# Patient Record
Sex: Male | Born: 1955
Health system: Southern US, Community
[De-identification: ages and names within clinical notes are randomized; demographics above are authoritative.]

## PROBLEM LIST (undated history)

## (undated) DIAGNOSIS — I10 Essential (primary) hypertension: Secondary | ICD-10-CM

## (undated) HISTORY — PX: ABDOMINAL SURGERY: SHX537

---

## 2015-03-09 ENCOUNTER — Other Ambulatory Visit (HOSPITAL_COMMUNITY): Payer: Self-pay | Admitting: Family Medicine

## 2015-03-09 DIAGNOSIS — Z841 Family history of disorders of kidney and ureter: Secondary | ICD-10-CM

## 2015-03-23 ENCOUNTER — Emergency Department (HOSPITAL_COMMUNITY)
Admission: EM | Admit: 2015-03-23 | Discharge: 2015-03-23 | Disposition: A | Discharge status: Home / Self Care | Payer: Self-pay | Attending: Emergency Medicine | Admitting: Emergency Medicine

## 2015-03-23 ENCOUNTER — Encounter (HOSPITAL_COMMUNITY): Payer: Self-pay | Admitting: Neurology

## 2015-03-23 DIAGNOSIS — I10 Essential (primary) hypertension: Secondary | ICD-10-CM

## 2015-03-23 DIAGNOSIS — Z87891 Personal history of nicotine dependence: Secondary | ICD-10-CM | POA: Insufficient documentation

## 2015-03-23 HISTORY — DX: Essential (primary) hypertension: I10

## 2015-03-23 LAB — I-STAT CHEM 8, ED
BUN: 23 mg/dL — ABNORMAL HIGH (ref 6–20)
Calcium, Ion: 1.19 mmol/L (ref 1.12–1.23)
Chloride: 103 mmol/L (ref 101–111)
Creatinine, Ser: 1.4 mg/dL — ABNORMAL HIGH (ref 0.61–1.24)
Glucose, Bld: 106 mg/dL — ABNORMAL HIGH (ref 65–99)
HCT: 46 % (ref 39.0–52.0)
Hemoglobin: 15.6 g/dL (ref 13.0–17.0)
Potassium: 4 mmol/L (ref 3.5–5.1)
Sodium: 141 mmol/L (ref 135–145)
TCO2: 27 mmol/L (ref 0–100)

## 2015-03-23 MED ORDER — BENAZEPRIL HCL 10 MG PO TABS: 10.0000 mg | ORAL_TABLET | Freq: Every day | ORAL | Status: AC

## 2015-03-23 MED ORDER — AMLODIPINE BESYLATE 5 MG PO TABS: 10.0000 mg | ORAL_TABLET | Freq: Every day | ORAL | Status: AC

## 2015-03-23 NOTE — Discharge Instructions (Signed)
Return here as needed. Follow up for a recheck with your doctor. INcrease your fluid intake

## 2015-03-23 NOTE — ED Notes (Signed)
Pt here after going to a doctor for a disability claim and found to have high BP. 178/122, 179/130, 183/131. Is taking lisinopril and followed by the [place]. Is here with h/a. Today BP 167/135. Denies cp or sob. Is a x 4.

## 2015-03-25 ENCOUNTER — Ambulatory Visit (HOSPITAL_COMMUNITY)
Admission: RE | Admit: 2015-03-25 | Discharge: 2015-03-25 | Disposition: A | Discharge status: Home / Self Care | Payer: No Typology Code available for payment source | Source: Ambulatory Visit | Attending: Family Medicine | Admitting: Family Medicine

## 2015-03-25 DIAGNOSIS — Z841 Family history of disorders of kidney and ureter: Secondary | ICD-10-CM

## 2015-03-26 NOTE — ED Provider Notes (Signed)
CSN: [id number]     Arrival date & time 03/23/15  1119 History   First MD Initiated Contact with Patient 03/23/15 1218     Chief Complaint  Patient presents with  . Hypertension     (Consider location/radiation/quality/duration/timing/severity/associated sxs/prior Treatment) HPI Patient presents to the emergency department with high blood pressure and that has been a chronic problem for the patient.  He is actually off of to his blood pressure medications.  He states that he has been seen at the [facility] for this and they wanted to see how his blood pressure would respond only one medication.  Patient was getting a disability claim, when they noted his blood pressure was high and sent him to the emergency department.  The patient denies chest pain, short of breath, nausea, vomiting, weakness, dizziness, headache, blurred vision, back pain, neck pain, fever, cough, dysuria, incontinence, bloody stool, hematemesis, abdominal pain, decreased urinary output, edema, near syncope or syncope.  The patient states that he has had some mild headache at times, but does not currently have a headache.  Nothing seems make the condition better or worse Past Medical History  Diagnosis Date  . Hypertension    No past surgical history on file. No family history on file. Social History  Substance Use Topics  . Smoking status: Former Games developer  . Smokeless tobacco: None  . Alcohol Use: Yes    Review of Systems  All other systems negative except as documented in the HPI. All pertinent positives and negatives as reviewed in the HPI.  Allergies  Review of patient's allergies indicates no known allergies.  Home Medications   Prior to Admission medications   Medication Sig Start Date End Date Taking? Authorizing Provider  amLODipine (NORVASC) 5 MG tablet Take 2 tablets (10 mg total) by mouth daily. 03/23/15   Manav Pierotti, PA-C  benazepril (LOTENSIN) 10 MG tablet Take 1 tablet (10 mg total) by mouth  daily. 03/23/15   Shenekia Riess, PA-C   BP 179/125 mmHg  Pulse 66  Temp(Src) 98.2 F (36.8 C) (Oral)  Resp 16  SpO2 100% Physical Exam  Constitutional: He is oriented to person, place, and time. He appears well-developed and well-nourished. No distress.  HENT:  Head: Normocephalic and atraumatic.  Mouth/Throat: Oropharynx is clear and moist.  Eyes: Pupils are equal, round, and reactive to light.  Neck: Normal range of motion. Neck supple.  Cardiovascular: Normal rate, regular rhythm and normal heart sounds.  Exam reveals no gallop and no friction rub.   No murmur heard. Pulmonary/Chest: Effort normal and breath sounds normal. No respiratory distress. He has no wheezes.  Abdominal: Soft. Bowel sounds are normal. He exhibits no distension. There is no tenderness.  Neurological: He is alert and oriented to person, place, and time. He exhibits normal muscle tone. Coordination normal.  Skin: Skin is warm and dry. No rash noted. No erythema.  Psychiatric: He has a normal mood and affect. His behavior is normal.  Nursing note and vitals reviewed.   ED Course  Procedures (including critical care time) Labs Review Labs Reviewed  I-STAT CHEM 8, ED - Abnormal; Notable for the following:    BUN 23 (*)    Creatinine, Ser 1.40 (*)    Glucose, Bld 106 (*)    All other components within normal limits    Imaging Review No results found. I have personally reviewed and evaluated these images and lab results as part of my medical decision-making.   EKG Interpretation   Date/Time:  Tuesday March 23 2015 11:36:08 EST Ventricular Rate:  76 PR Interval:  164 QRS Duration: 82 QT Interval:  370 QTC Calculation: 416 R Axis:   57 Text Interpretation:  Normal sinus rhythm T wave abnormality, consider  inferior ischemia Abnormal ECG ED PHYSICIAN INTERPRETATION AVAILABLE IN  [facility] Confirmed by TEST, Record ([id number]) on 03/24/2015 8:01:09 AM      Patient is advised follow-up  with his primary care doctor.  He does not have any signs end organ damage as result of his blood pressure.  Patient was started back on the 2 medications that were stopped.  Patient's best return here as needed.  Told to monitor his blood pressure   Charlestine Night, PA-C 03/27/15 0000  Blane Ohara, MD 03/28/15 [phone]

## 2016-08-07 ENCOUNTER — Emergency Department (HOSPITAL_COMMUNITY): Payer: Non-veteran care

## 2016-08-07 ENCOUNTER — Encounter (HOSPITAL_COMMUNITY): Payer: Self-pay | Admitting: Emergency Medicine

## 2016-08-07 ENCOUNTER — Emergency Department (HOSPITAL_COMMUNITY)
Admission: EM | Admit: 2016-08-07 | Discharge: 2016-08-07 | Disposition: A | Discharge status: Home / Self Care | Payer: Non-veteran care | Attending: Emergency Medicine | Admitting: Emergency Medicine

## 2016-08-07 DIAGNOSIS — K2970 Gastritis, unspecified, without bleeding: Secondary | ICD-10-CM | POA: Diagnosis not present

## 2016-08-07 DIAGNOSIS — R809 Proteinuria, unspecified: Secondary | ICD-10-CM | POA: Diagnosis not present

## 2016-08-07 DIAGNOSIS — I10 Essential (primary) hypertension: Secondary | ICD-10-CM | POA: Insufficient documentation

## 2016-08-07 DIAGNOSIS — R112 Nausea with vomiting, unspecified: Secondary | ICD-10-CM

## 2016-08-07 DIAGNOSIS — Z87891 Personal history of nicotine dependence: Secondary | ICD-10-CM | POA: Diagnosis not present

## 2016-08-07 DIAGNOSIS — R634 Abnormal weight loss: Secondary | ICD-10-CM | POA: Diagnosis not present

## 2016-08-07 DIAGNOSIS — N2889 Other specified disorders of kidney and ureter: Secondary | ICD-10-CM | POA: Diagnosis not present

## 2016-08-07 DIAGNOSIS — R1013 Epigastric pain: Secondary | ICD-10-CM | POA: Diagnosis not present

## 2016-08-07 DIAGNOSIS — Z79899 Other long term (current) drug therapy: Secondary | ICD-10-CM | POA: Insufficient documentation

## 2016-08-07 DIAGNOSIS — R935 Abnormal findings on diagnostic imaging of other abdominal regions, including retroperitoneum: Secondary | ICD-10-CM

## 2016-08-07 DIAGNOSIS — N289 Disorder of kidney and ureter, unspecified: Secondary | ICD-10-CM

## 2016-08-07 LAB — COMPREHENSIVE METABOLIC PANEL
ALK PHOS: 62 U/L (ref 38–126)
ALT: 12 U/L — ABNORMAL LOW (ref 17–63)
ANION GAP: 12 (ref 5–15)
AST: 19 U/L (ref 15–41)
Albumin: 4.1 g/dL (ref 3.5–5.0)
BILIRUBIN TOTAL: 0.9 mg/dL (ref 0.3–1.2)
BUN: 17 mg/dL (ref 6–20)
CALCIUM: 9.5 mg/dL (ref 8.9–10.3)
CO2: 25 mmol/L (ref 22–32)
Chloride: 98 mmol/L — ABNORMAL LOW (ref 101–111)
Creatinine, Ser: 1.39 mg/dL — ABNORMAL HIGH (ref 0.61–1.24)
GFR calc Af Amer: >60 mL/min (ref >60)
GFR calc non Af Amer: 54 mL/min — ABNORMAL LOW (ref >60)
GLUCOSE: 118 mg/dL — AB (ref 65–99)
POTASSIUM: 3.7 mmol/L (ref 3.5–5.1)
Sodium: 135 mmol/L (ref 135–145)
TOTAL PROTEIN: 7.2 g/dL (ref 6.5–8.1)

## 2016-08-07 LAB — CBC
HEMATOCRIT: 44 % (ref 39.0–52.0)
HEMOGLOBIN: 14.6 g/dL (ref 13.0–17.0)
MCH: 28.5 pg (ref 26.0–34.0)
MCHC: 33.2 g/dL (ref 30.0–36.0)
MCV: 85.8 fL (ref 78.0–100.0)
Platelets: 281 10*3/uL (ref 150–400)
RBC: 5.13 MIL/uL (ref 4.22–5.81)
RDW: 13.6 % (ref 11.5–15.5)
WBC: 8.1 10*3/uL (ref 4.0–10.5)

## 2016-08-07 LAB — URINALYSIS, ROUTINE W REFLEX MICROSCOPIC
BACTERIA UA: NONE SEEN
BILIRUBIN URINE: NEGATIVE
GLUCOSE, UA: NEGATIVE mg/dL
HGB URINE DIPSTICK: NEGATIVE
Ketones, ur: 5 mg/dL — AB
LEUKOCYTES UA: NEGATIVE
NITRITE: NEGATIVE
Protein, ur: 30 mg/dL — AB
SPECIFIC GRAVITY, URINE: 1.021 (ref 1.005–1.030)
Squamous Epithelial / LPF: NONE SEEN
pH: 5 (ref 5.0–8.0)

## 2016-08-07 LAB — LIPASE, BLOOD: Lipase: 29 U/L (ref 11–51)

## 2016-08-07 MED ORDER — HYDROCODONE-ACETAMINOPHEN 5-325 MG PO TABS: 1.0000 | ORAL_TABLET | Freq: Four times a day (QID) | ORAL | 0 refills | Status: DC | PRN

## 2016-08-07 MED ORDER — FAMOTIDINE IN NACL 20-0.9 MG/50ML-% IV SOLN
20.0000 mg | Freq: Once | INTRAVENOUS | Status: AC
  Administered 2016-08-07: 20 mg via INTRAVENOUS
  Filled 2016-08-07: qty 50

## 2016-08-07 MED ORDER — ONDANSETRON 4 MG PO TBDP
ORAL_TABLET | ORAL | Status: AC
  Filled 2016-08-07: qty 1

## 2016-08-07 MED ORDER — ONDANSETRON 4 MG PO TBDP
4.0000 mg | ORAL_TABLET | Freq: Once | ORAL | Status: AC | PRN
  Administered 2016-08-07: 4 mg via ORAL

## 2016-08-07 MED ORDER — GI COCKTAIL ~~LOC~~
30.0000 mL | Freq: Once | ORAL | Status: AC
  Administered 2016-08-07: 30 mL via ORAL
  Filled 2016-08-07: qty 30

## 2016-08-07 MED ORDER — IOPAMIDOL (ISOVUE-300) INJECTION 61%
INTRAVENOUS | Status: AC
  Administered 2016-08-07: 100 mL
  Filled 2016-08-07: qty 100

## 2016-08-07 MED ORDER — LISINOPRIL 10 MG PO TABS
5.0000 mg | ORAL_TABLET | Freq: Once | ORAL | Status: AC
  Administered 2016-08-07: 5 mg via ORAL
  Filled 2016-08-07: qty 1

## 2016-08-07 MED ORDER — ONDANSETRON 4 MG PO TBDP: 4.0000 mg | ORAL_TABLET | Freq: Three times a day (TID) | ORAL | 0 refills | Status: AC | PRN

## 2016-08-07 MED ORDER — RANITIDINE HCL 150 MG PO TABS: 150.0000 mg | ORAL_TABLET | Freq: Two times a day (BID) | ORAL | 0 refills | Status: AC

## 2016-08-07 MED ORDER — SODIUM CHLORIDE 0.9 % IV BOLUS (SEPSIS)
1000.0000 mL | Freq: Once | INTRAVENOUS | Status: AC
  Administered 2016-08-07: 1000 mL via INTRAVENOUS

## 2016-08-07 NOTE — ED Notes (Signed)
Pt's wife stated, he has not had his medication due to eating. Given crackers and ginger ale to take medication

## 2016-08-07 NOTE — Discharge Instructions (Signed)
The CT scan of your belly showed that you have some lesions on both kidneys, which is concerning for possible cancer. It's very important that you speak with your regular doctor as soon as possible, and tell them that you need to have a follow up evaluation with an oncologist as soon as possible, perhaps while you're at the [facility] for your appointment next week. You will need further outpatient work up for this CT finding.   You may want to get the images of the CT from today's visit; you can come back during normal business hours to the medical records department in order to get a CD with copies of the images from today's CT.  Your symptoms could also be contributed by gastritis or an ulcer. You will need to take zantac as directed, and avoid spicy/fatty/acidic foods, avoid soda/coffee/tea/alcohol. Avoid laying down flat within 30 minutes of eating. Avoid NSAIDs like ibuprofen/aleve/motrin/etc on an empty stomach. May consider using over the counter tums/maalox as needed for additional relief. Use zofran as directed as needed for nausea. Stay very well hydrated with plenty of water. Use norco as directed as needed for pain but don't drive or operate machinery while taking this medication. This medication may cause constipation, increase the fiber and water in your diet, and if you develop constipation from the medication, consider taking miralax or over the counter stool softener to help with this issue. Follow up with your regular doctor in 1 week for recheck of symptoms and ongoing management of your abnormal CT findings, in addition to oncology as previously stated above. Return to the ER for changes or worsening symptoms.

## 2016-08-07 NOTE — ED Triage Notes (Signed)
Pt reports abd pain, RUQ, x 1 week, followed by n/v/d for 6 days.  Pt reports subjective chills, denies any other family members with same s/s.  Pt reports hx abd surgery in RUQ for tumor removal.

## 2016-08-07 NOTE — ED Notes (Signed)
Patient transported to CT 

## 2016-08-07 NOTE — ED Provider Notes (Signed)
[facility]-EMERGENCY DEPT Provider Note   CSN: [id number] Arrival date & time: 08/07/16  1006     History   Chief Complaint Chief Complaint  Patient presents with  . Emesis  . Abdominal Pain  . Diarrhea    HPI Eric Mckee is a 61 y.o. male with a PMHx of HTN and RUQ surgery in 2012 at the [place] for "tumor removal" although he's not sure where the tumor was (?gallbladder, but not sure), who presents to the ED with complaints of epigastric abdominal pain that began one week ago with associated nausea, vomiting, constipation, and chills. He describes his pain is 7/10 intermittent sharp nonradiating epigastric abdominal pain that worsens when he vomits and has been unrelieved with BC powders, Tylenol, and fluid intake. He states that he has not been able to keep anything down over the last week, including his blood pressure medications. He hasn't taken any BP meds today. He usually is on lisinopril 5 mg daily. He states that he has had too numerous to count episodes of nonbloody nonbilious emesis. He also mentions that he hasn't had a BM in 1 week but he attributes that to the fact that he has not been able to keep anything in his stomach this week. Additionally reports a 23 pound weight loss since March and he wonders whether his prior tumor surgery could be contributing to his pain and symptoms today. He can't recall where the tumor was that was removed, he thinks it could be his gallbladder however he's not sure; doesn't know if he still has his gallbladder. He has an appt with his PCP Dr. Lynnea Ferrier on 08/15/16 (@[facility]  in [place]). He admits to alcohol use occasionally, stating that he drinks a few beers every couple days, his last beer was one week ago. He has consumed some spicy food recently and he wonders whether this could be contributing. He also admits to frequent NSAID use.  He denies fevers, CP, SOB, diarrhea, obstipation/diminished flatus, melena, hematochezia, hematemesis,  Rectal  pain, hematuria, dysuria, testicular pain/swelling, penile discharge, myalgias, arthralgias, numbness, tingling, focal weakness, vision changes, HA, LE swelling, or any other complaints at this time. Denies recent travel, sick contacts, suspicious food intake, or other prior abd surgeries.    The history is provided by the patient and medical records. No language interpreter was used.  Emesis   Associated symptoms include abdominal pain and chills. Pertinent negatives include no arthralgias, no diarrhea, no fever, no headaches and no myalgias.  Abdominal Pain   This is a new problem. The current episode started more than 2 days ago. The problem occurs hourly. The problem has not changed since onset.The pain is associated with an unknown factor. The pain is located in the epigastric region. The quality of the pain is sharp. The pain is at a severity of 7/10. The pain is moderate. Associated symptoms include nausea, vomiting and constipation. Pertinent negatives include fever, diarrhea, flatus, hematochezia, melena, dysuria, hematuria, headaches, arthralgias and myalgias. The symptoms are aggravated by vomiting. Nothing relieves the symptoms. Past workup includes surgery.  Diarrhea   Associated symptoms include abdominal pain, vomiting and chills. Pertinent negatives include no headaches, no arthralgias and no myalgias.    Past Medical History:  Diagnosis Date  . Hypertension     There are no active problems to display for this patient.   Past Surgical History:  Procedure Laterality Date  . ABDOMINAL SURGERY         Home Medications  Prior to Admission medications   Medication Sig Start Date End Date Taking? Authorizing Provider  amLODipine (NORVASC) 5 MG tablet Take 2 tablets (10 mg total) by mouth daily. 03/23/15   Lawyer, Cristal Deer, PA-C  benazepril (LOTENSIN) 10 MG tablet Take 1 tablet (10 mg total) by mouth daily. 03/23/15   Charlestine Night, PA-C    Family History No  family history on file.  Social History Social History  Substance Use Topics  . Smoking status: Former Games developer  . Smokeless tobacco: Never Used  . Alcohol use Yes     Comment: "EVERY NOW AND THEN"     Allergies   Patient has no known allergies.   Review of Systems Review of Systems  Constitutional: Positive for chills and unexpected weight change. Negative for fever.  Eyes: Negative for visual disturbance.  Respiratory: Negative for shortness of breath.   Cardiovascular: Negative for chest pain and leg swelling.  Gastrointestinal: Positive for abdominal pain, constipation, nausea and vomiting. Negative for anal bleeding, blood in stool, diarrhea, flatus, hematochezia, melena and rectal pain.  Genitourinary: Negative for discharge, dysuria, hematuria, scrotal swelling and testicular pain.  Musculoskeletal: Negative for arthralgias and myalgias.  Skin: Negative for color change.  Allergic/Immunologic: Negative for immunocompromised state.  Neurological: Negative for weakness, numbness and headaches.  Psychiatric/Behavioral: Negative for confusion.   All other systems reviewed and are negative for acute change except as noted in the HPI.    Physical Exam Updated Vital Signs BP (!) 169/126 (BP Location: Right Arm)   Pulse 61   Temp 97.9 F (36.6 C) (Oral)   Resp 16   Ht 5\' 8"  (1.727 m)   Wt 67.6 kg (149 lb)   SpO2 95%   BMI 22.66 kg/m   Physical Exam  Constitutional: He is oriented to person, place, and time. Vital signs are normal. He appears well-developed and well-nourished.  Non-toxic appearance. No distress.  Afebrile, nontoxic, NAD, HTN noted but similar to prior ED visits  HENT:  Head: Normocephalic and atraumatic.  Mouth/Throat: Oropharynx is clear and moist and mucous membranes are normal.  Eyes: Conjunctivae and EOM are normal. Right eye exhibits no discharge. Left eye exhibits no discharge.  Neck: Normal range of motion. Neck supple.  Cardiovascular: Normal  rate, regular rhythm, normal heart sounds and intact distal pulses.  Exam reveals no gallop and no friction rub.   No murmur heard. Pulmonary/Chest: Effort normal and breath sounds normal. No respiratory distress. He has no decreased breath sounds. He has no wheezes. He has no rhonchi. He has no rales.  Abdominal: Soft. Normal appearance and bowel sounds are normal. He exhibits no distension. There is tenderness in the epigastric area. There is no rigidity, no rebound, no guarding, no CVA tenderness, no tenderness at McBurney's point and negative Murphy's sign. A hernia (incisional) is present.    Soft, nondistended, +BS throughout, with well healed scar extending from epigastric region along the costal margin of the RUQ, with a small quarter-sized incisional hernia at the epigastric point of the scar, protrudes with valsalva but self-reduces after valsalva completed; mild tenderness over the hernia area in the epigastric region, no r/g/r, neg murphy's, neg mcburney's, no CVA TTP   Musculoskeletal: Normal range of motion.  Neurological: He is alert and oriented to person, place, and time. He has normal strength. No sensory deficit.  Skin: Skin is warm, dry and intact. No rash noted.  Psychiatric: He has a normal mood and affect.  Nursing note and vitals reviewed.  ED Treatments / Results  Labs (all labs ordered are listed, but only abnormal results are displayed) Labs Reviewed  COMPREHENSIVE METABOLIC PANEL - Abnormal; Notable for the following:       Result Value   Chloride 98 (*)    Glucose, Bld 118 (*)    Creatinine, Ser 1.39 (*)    ALT 12 (*)    GFR calc non Af Amer 54 (*)    All other components within normal limits  URINALYSIS, ROUTINE W REFLEX MICROSCOPIC - Abnormal; Notable for the following:    Ketones, ur 5 (*)    Protein, ur 30 (*)    All other components within normal limits  URINE CULTURE  LIPASE, BLOOD  CBC    EKG  EKG Interpretation None       Radiology Ct  Abdomen Pelvis W Contrast  Result Date: 08/07/2016 CLINICAL DATA:  Vomiting for 3 days. Decreased appetite. Epigastric pain. Constipation. Evaluate for obstruction or perforation. EXAM: CT ABDOMEN AND PELVIS WITH CONTRAST TECHNIQUE: Multidetector CT imaging of the abdomen and pelvis was performed using the standard protocol following bolus administration of intravenous contrast. CONTRAST:  ISOVUE-300 IOPAMIDOL (ISOVUE-300) INJECTION 61% COMPARISON:  None. FINDINGS: Lower chest: Clear lung bases. Normal heart size without pericardial or pleural effusion. Hepatobiliary: Surgical changes about the posterior right lobe of the liver. No focal liver lesion. Normal gallbladder, without biliary ductal dilatation. Pancreas: Normal, without mass or ductal dilatation. Spleen: Normal in size, without focal abnormality. Adrenals/Urinary Tract: Normal left adrenal gland. Right renal too small to characterize lesion. An lower pole right renal lesion measures 2.2 cm and greater than fluid density, including on image 34/series 3. Heterogeneous upper pole left renal lesion measures 2.4 cm on image 8/series a 8. This is likely enhancing, as evidenced by increased density on nephrographic phase images relative to kidney delayed images. No hydronephrosis. Normal urinary bladder. Stomach/Bowel: Proximal gastric underdistention. Apparent gastric wall thickening is likely secondary. Example image 22/series 3. Normal colon, appendix, and terminal ileum. Normal small bowel. Vascular/Lymphatic: Aortic and branch vessel atherosclerosis. Ectasia of bilateral common iliac arteries, 1.5 cm on the right and 1.4 cm on the left. No abdominopelvic adenopathy. Reproductive: Normal prostate. Other: No significant free fluid. No free intraperitoneal air. Mild ventral periumbilical wall laxity containing fat. Musculoskeletal: Degenerate changes of both sacroiliac joints. IMPRESSION: 1.  No acute process in the abdomen or pelvis. 2. **An incidental  finding of potential clinical significance has been found. Bilateral renal lesions. A left-sided upper pole lesion is highly suspicious for renal cell carcinoma. An inter/lower pole right-sided lesion is indeterminate and could represent a complex cyst or solid neoplasm. Recommend nonemergent outpatient follow-up with pre and post contrast abdominal MRI.** 3.  Aortic Atherosclerosis (ICD10-I70.0). 4. Gastric underdistention. Apparent wall thickening could be secondary. Cannot exclude gastritis or other gastric pathology. These results were called by telephone at the time of interpretation on 08/07/2016 at 5:13 pm to [facility] Briele Lagasse, P.A., who verbally acknowledged these results. Electronically Signed   By: Jeronimo Greaves M.D.   On: 08/07/2016 17:18    Procedures Procedures (including critical care time)  Medications Ordered in ED Medications  ondansetron (ZOFRAN-ODT) 4 MG disintegrating tablet (not administered)  ondansetron (ZOFRAN-ODT) disintegrating tablet 4 mg (4 mg Oral Given 08/07/16 1023)  lisinopril (PRINIVIL,ZESTRIL) tablet 5 mg (5 mg Oral Given 08/07/16 1619)  sodium chloride 0.9 % bolus 1,000 mL (0 mLs Intravenous Stopped 08/07/16 1752)  famotidine (PEPCID) IVPB 20 mg premix (0 mg Intravenous Stopped 08/07/16 1631)  gi cocktail (Maalox,Lidocaine,Donnatal) (30 mLs Oral Given 08/07/16 1608)  iopamidol (ISOVUE-300) 61 % injection (100 mLs  Contrast Given 08/07/16 1628)     Initial Impression / Assessment and Plan / ED Course  I have reviewed the triage vital signs and the nursing notes.  Pertinent labs & imaging results that were available during my care of the patient were reviewed by me and considered in my medical decision making (see chart for details).     61 y.o. male here with epigastric abd pain x1wk, and n/v x6 days. No BM in 1wk, which he states is because he hasn't been able to keep anything down. Also mentions weight loss of 23# since March. BP meds unable to stay down d/t n/v, so hasn't  taken any today; HTN noted, similar to prior visits, and pt asymptomatic. Zofran given earlier, which improved his nausea; will PO challenge and give his home BP dose. On exam, large healed scar in RUQ, with small incisional hernia noted to epigastric portion of the incision, protrudes with valsalva and then self-reduces, mild tenderness over this area, no RUQ tenderness and neg murphy's exam, nonperitoneal. Afebrile and nontoxic. Labs thus far: lipase WNL, CMP with Cr 1.39 similar to prior, otherwise unremarkable; CBC WNL. Awaiting U/A, will also get CT abd/pelv to ensure no obstruction vs masses vs other etiology of symptoms. Will give pepcid, GI cocktail, fluids, and reassess shortly. Pt declines wanting anything more for nausea or pain at this time. Will reassess after work up completed.   6:37 PM U/A with few ketones and protein which could be related to dehydration; 0-5 WBC and RBCs, neg nitrites and leuks, no squamous cells and no bacteria seen; UCx added on given WBC finding, but doubt UTI particularly given lack of symptoms and lack of bacteruria. CT abd/pelv without acute finding, however incidental finding of b/l renal lesions highly suspicious for renal cell carcinoma, recommending nonemergent outpatient pre/post-contrast MRI of abdomen; also showing under-distension of stomach with some mild wall thickening, cannot exclude gastritis or other gastric pathology. Pt feeling much better and tolerating PO well, BP improving after home BP meds given and pain better controlled. Since he has no acute reason to need admission, he can f/up outpatient for further work up of the renal lesion findings, and we shouldn't need urgent consultation for this; Discussed case with my attending Dr. Freida BusmanAllen (since Dr. Lynelle DoctorKnapp had left prior to CT results, so I discussed it with the next attending that came on) who agrees with plan. Discussed with pt at length the importance of calling his PCP and letting them know that he  needs to see an oncologist ASAP, and perhaps see if they can arrange that to be done when he goes to his appt next week. Discussed that it's extremely important that he f/up with this finding, and for further outpatient work up. Will send home with zantac, zofran, and pain meds; advised diet/lifestyle changes for gastritis; EtOH and NSAID avoidance advised; discussed use of miralax/colace/stool softener and high fiber diet/increased water intake if constipation occurs with narcotic use (doubt he's constipated now, given lack of large stool burden on CT; it's likely that his lack of BM this wk is from diminished PO intake). F/up with PCP in 1wk for ongoing management and recheck, and f/up with oncologist ASAP for further work up of his abnormal CT finding. [place] reviewed prior to dispensing controlled substance medications, and 1 year search was notable for: no controlled substances found. Risks/benefits/alternatives and expectations discussed  regarding controlled substances. Side effects of medications discussed. Informed consent obtained. I explained the diagnosis and have given explicit precautions to return to the ER including for any other new or worsening symptoms. The patient understands and accepts the medical plan as it's been dictated and I have answered their questions. Discharge instructions concerning home care and prescriptions have been given. The patient is STABLE and is discharged to home in good condition.    Final Clinical Impressions(s) / ED Diagnoses   Final diagnoses:  Epigastric abdominal pain  Nausea and vomiting in adult patient  Weight loss, unintentional  Essential hypertension  Proteinuria, unspecified type  Abnormal abdominal CT scan  Renal lesion  Gastritis, presence of bleeding unspecified, unspecified chronicity, unspecified gastritis type    New Prescriptions New Prescriptions   HYDROCODONE-ACETAMINOPHEN (NORCO) 5-325 MG TABLET    Take 1 tablet by mouth  every 6 (six) hours as needed for severe pain.   ONDANSETRON (ZOFRAN ODT) 4 MG DISINTEGRATING TABLET    Take 1 tablet (4 mg total) by mouth every 8 (eight) hours as needed for nausea or vomiting.   RANITIDINE (ZANTAC) 150 MG TABLET    Take 1 tablet (150 mg total) by mouth 2 (two) times daily.     [address], [place], [place] 08/07/16 1843    Linwood Dibbles, MD 08/09/16 [phone]

## 2016-08-07 NOTE — ED Notes (Signed)
Pt. Stated, the nausea better due of the medicine they took

## 2016-08-07 NOTE — ED Notes (Signed)
Pt tolerated PO water well.

## 2016-08-09 LAB — URINE CULTURE: Culture: NO GROWTH

## 2016-08-18 ENCOUNTER — Encounter (HOSPITAL_COMMUNITY): Payer: Self-pay

## 2016-08-18 DIAGNOSIS — Z5321 Procedure and treatment not carried out due to patient leaving prior to being seen by health care provider: Secondary | ICD-10-CM | POA: Insufficient documentation

## 2016-08-18 DIAGNOSIS — Z7983 Long term (current) use of bisphosphonates: Secondary | ICD-10-CM | POA: Diagnosis not present

## 2016-08-18 DIAGNOSIS — D509 Iron deficiency anemia, unspecified: Secondary | ICD-10-CM | POA: Diagnosis not present

## 2016-08-18 DIAGNOSIS — Z79899 Other long term (current) drug therapy: Secondary | ICD-10-CM | POA: Diagnosis not present

## 2016-08-18 DIAGNOSIS — Z008 Encounter for other general examination: Secondary | ICD-10-CM | POA: Diagnosis present

## 2016-08-18 LAB — CBC
HEMATOCRIT: 24 % — AB (ref 39.0–52.0)
HEMOGLOBIN: 7.5 g/dL — AB (ref 13.0–17.0)
MCH: 27.5 pg (ref 26.0–34.0)
MCHC: 31.3 g/dL (ref 30.0–36.0)
MCV: 87.9 fL (ref 78.0–100.0)
Platelets: 345 10*3/uL (ref 150–400)
RBC: 2.73 MIL/uL — AB (ref 4.22–5.81)
RDW: 14.5 % (ref 11.5–15.5)
WBC: 8.5 10*3/uL (ref 4.0–10.5)

## 2016-08-18 LAB — BASIC METABOLIC PANEL
ANION GAP: 10 (ref 5–15)
BUN: 20 mg/dL (ref 6–20)
CHLORIDE: 102 mmol/L (ref 101–111)
CO2: 25 mmol/L (ref 22–32)
Calcium: 8.6 mg/dL — ABNORMAL LOW (ref 8.9–10.3)
Creatinine, Ser: 1.73 mg/dL — ABNORMAL HIGH (ref 0.61–1.24)
GFR calc Af Amer: 48 mL/min — ABNORMAL LOW (ref >60)
GFR, EST NON AFRICAN AMERICAN: 42 mL/min — AB (ref >60)
GLUCOSE: 113 mg/dL — AB (ref 65–99)
POTASSIUM: 3.9 mmol/L (ref 3.5–5.1)
Sodium: 137 mmol/L (ref 135–145)

## 2016-08-18 NOTE — ED Triage Notes (Signed)
Pt states that he was [facility] and told his HBG was low and was told to come back here to have a recheck. Told it was 8.5 then, denies rectal bleeding, denies SOB.

## 2016-08-19 ENCOUNTER — Encounter (HOSPITAL_COMMUNITY): Payer: Self-pay | Admitting: Emergency Medicine

## 2016-08-19 ENCOUNTER — Emergency Department (HOSPITAL_COMMUNITY)
Admission: EM | Admit: 2016-08-19 | Discharge: 2016-08-19 | Disposition: A | Discharge status: Home / Self Care | Payer: Non-veteran care | Attending: Emergency Medicine | Admitting: Emergency Medicine

## 2016-08-19 ENCOUNTER — Emergency Department (HOSPITAL_COMMUNITY)
Admission: EM | Admit: 2016-08-19 | Discharge: 2016-08-19 | Disposition: A | Discharge status: Home / Self Care | Payer: Non-veteran care | Source: Home / Self Care | Attending: Emergency Medicine | Admitting: Emergency Medicine

## 2016-08-19 DIAGNOSIS — D649 Anemia, unspecified: Secondary | ICD-10-CM

## 2016-08-19 DIAGNOSIS — Z7983 Long term (current) use of bisphosphonates: Secondary | ICD-10-CM | POA: Insufficient documentation

## 2016-08-19 DIAGNOSIS — D509 Iron deficiency anemia, unspecified: Secondary | ICD-10-CM

## 2016-08-19 DIAGNOSIS — Z79899 Other long term (current) drug therapy: Secondary | ICD-10-CM | POA: Insufficient documentation

## 2016-08-19 LAB — CBC WITH DIFFERENTIAL/PLATELET
BASOS PCT: 0 %
Basophils Absolute: 0 10*3/uL (ref 0.0–0.1)
EOS ABS: 0.3 10*3/uL (ref 0.0–0.7)
Eosinophils Relative: 4 %
HEMATOCRIT: 25.7 % — AB (ref 39.0–52.0)
Hemoglobin: 8 g/dL — ABNORMAL LOW (ref 13.0–17.0)
Lymphocytes Relative: 25 %
Lymphs Abs: 1.8 10*3/uL (ref 0.7–4.0)
MCH: 27.4 pg (ref 26.0–34.0)
MCHC: 31.1 g/dL (ref 30.0–36.0)
MCV: 88 fL (ref 78.0–100.0)
MONOS PCT: 11 %
Monocytes Absolute: 0.8 10*3/uL (ref 0.1–1.0)
NEUTROS ABS: 4.3 10*3/uL (ref 1.7–7.7)
Neutrophils Relative %: 60 %
Platelets: 391 10*3/uL (ref 150–400)
RBC: 2.92 MIL/uL — ABNORMAL LOW (ref 4.22–5.81)
RDW: 14.5 % (ref 11.5–15.5)
WBC: 7.1 10*3/uL (ref 4.0–10.5)

## 2016-08-19 LAB — FOLATE: FOLATE: 12.5 ng/mL (ref >5.9)

## 2016-08-19 LAB — COMPREHENSIVE METABOLIC PANEL
ALBUMIN: 3.3 g/dL — AB (ref 3.5–5.0)
ALK PHOS: 56 U/L (ref 38–126)
ALT: 15 U/L — AB (ref 17–63)
AST: 18 U/L (ref 15–41)
Anion gap: 7 (ref 5–15)
BUN: 18 mg/dL (ref 6–20)
CALCIUM: 8.8 mg/dL — AB (ref 8.9–10.3)
CO2: 25 mmol/L (ref 22–32)
CREATININE: 1.53 mg/dL — AB (ref 0.61–1.24)
Chloride: 106 mmol/L (ref 101–111)
GFR calc Af Amer: 56 mL/min — ABNORMAL LOW (ref >60)
GFR calc non Af Amer: 48 mL/min — ABNORMAL LOW (ref >60)
GLUCOSE: 100 mg/dL — AB (ref 65–99)
Potassium: 4.5 mmol/L (ref 3.5–5.1)
SODIUM: 138 mmol/L (ref 135–145)
Total Bilirubin: 0.2 mg/dL — ABNORMAL LOW (ref 0.3–1.2)
Total Protein: 5.8 g/dL — ABNORMAL LOW (ref 6.5–8.1)

## 2016-08-19 LAB — IRON AND TIBC
Iron: 17 ug/dL — ABNORMAL LOW (ref 45–182)
Saturation Ratios: 5 % — ABNORMAL LOW (ref 17.9–39.5)
TIBC: 361 ug/dL (ref 250–450)
UIBC: 344 ug/dL

## 2016-08-19 LAB — POC OCCULT BLOOD, ED: Fecal Occult Bld: NEGATIVE

## 2016-08-19 LAB — RETICULOCYTES
RBC.: 2.92 MIL/uL — ABNORMAL LOW (ref 4.22–5.81)
RETIC COUNT ABSOLUTE: 125.6 10*3/uL (ref 19.0–186.0)
RETIC CT PCT: 4.3 % — AB (ref 0.4–3.1)

## 2016-08-19 LAB — FERRITIN: FERRITIN: 10 ng/mL — AB (ref 24–336)

## 2016-08-19 LAB — VITAMIN B12: Vitamin B-12: 453 pg/mL (ref 180–914)

## 2016-08-19 NOTE — ED Notes (Signed)
Called for pt in lobby x 3 for room, unable to locate

## 2016-08-19 NOTE — ED Provider Notes (Signed)
[facility] Provider Note   CSN: [id number] Arrival date & time: 08/19/16  0757     History   Chief Complaint Chief Complaint  Patient presents with  . Abnormal Lab    HPI Eric Mckee is a 61 y.o. male.  The history is provided by the patient. No language interpreter was used.  Abnormal Lab    Eric Mckee is a 61 y.o. male who presents to the Emergency Department complaining of abnormal lab.  He was seen at the [place] earlier this week and had a routine lab draw was told that his hemoglobin was low at 8.5. He was told to return to the nurses called to get his labs rechecked. He was seen on July 2 at [facility] for multiple symptoms including cough, nausea, malaise. He reports that all of the symptoms are gone and he is feeling well. He denies any current chest pain, shortness of breath, fatigue, dizziness, back pain, vomiting, black or bloody stools, weight loss. He has a history of adrenal cancer, status post resection in 2012 as well as hypertension. He had a colonoscopy 3 years ago was told he had some polyps but the pathology came back benign. He had an upper endoscopy with no abnormalities. All these procedures were performed at the [place] in [place]. He presented to the ED last night for evaluation and had labs drawn but was unable to stay for results.  Past Medical History:  Diagnosis Date  . Hypertension     There are no active problems to display for this patient.   Past Surgical History:  Procedure Laterality Date  . ABDOMINAL SURGERY         Home Medications    Prior to Admission medications   Medication Sig Start Date End Date Taking? Authorizing Provider  amLODipine (NORVASC) 5 MG tablet Take 2 tablets (10 mg total) by mouth daily. Patient not taking: Reported on 08/07/2016 03/23/15   Charlestine Night, PA-C  Aspirin-Salicylamide-Caffeine (BC HEADACHE POWDER PO) Take 1 packet by mouth 2 (two) times daily as needed (headache/pain).     [provider]    benazepril (LOTENSIN) 10 MG tablet Take 1 tablet (10 mg total) by mouth daily. Patient not taking: Reported on 08/07/2016 03/23/15   Charlestine Night, PA-C  HYDROcodone-acetaminophen (NORCO) 5-325 MG tablet Take 1 tablet by mouth every 6 (six) hours as needed for severe pain. 08/07/16   Street, [place], PA-C  lisinopril (PRINIVIL,ZESTRIL) 5 MG tablet Take 5 mg by mouth daily.    [provider]  ondansetron (ZOFRAN ODT) 4 MG disintegrating tablet Take 1 tablet (4 mg total) by mouth every 8 (eight) hours as needed for nausea or vomiting. 08/07/16   Street, Mercedes, PA-C  ranitidine (ZANTAC) 150 MG tablet Take 1 tablet (150 mg total) by mouth 2 (two) times daily. 08/07/16   Street, [place], PA-C    Family History No family history on file.  Social History Social History  Substance Use Topics  . Smoking status: Former Games developer  . Smokeless tobacco: Never Used  . Alcohol use Yes     Comment: "EVERY NOW AND THEN"     Allergies   Patient has no known allergies.   Review of Systems Review of Systems  All other systems reviewed and are negative.    Physical Exam Updated Vital Signs BP 119/90   Pulse 91   Temp 97.9 F (36.6 C) (Oral)   Resp 20   SpO2 100%   Physical Exam  Constitutional: He is  oriented to person, place, and time. He appears well-developed and well-nourished.  HENT:  Head: Normocephalic and atraumatic.  Cardiovascular: Normal rate and regular rhythm.   No murmur heard. Pulmonary/Chest: Effort normal and breath sounds normal. No respiratory distress.  Abdominal: Soft. There is no tenderness. There is no rebound and no guarding.  Genitourinary:  Genitourinary Comments: Rectal exam nontender, brown stool  Musculoskeletal: He exhibits no edema or tenderness.  Neurological: He is alert and oriented to person, place, and time.  Skin: Skin is warm and dry.  Psychiatric: He has a normal mood and affect. His behavior is normal.  Nursing note and vitals  reviewed.    ED Treatments / Results  Labs (all labs ordered are listed, but only abnormal results are displayed) Labs Reviewed  COMPREHENSIVE METABOLIC PANEL - Abnormal; Notable for the following:       Result Value   Glucose, Bld 100 (*)    Creatinine, Ser 1.53 (*)    Calcium 8.8 (*)    Total Protein 5.8 (*)    Albumin 3.3 (*)    ALT 15 (*)    Total Bilirubin 0.2 (*)    GFR calc non Af Amer 48 (*)    GFR calc Af Amer 56 (*)    All other components within normal limits  CBC WITH DIFFERENTIAL/PLATELET - Abnormal; Notable for the following:    RBC 2.92 (*)    Hemoglobin 8.0 (*)    HCT 25.7 (*)    All other components within normal limits  IRON AND TIBC - Abnormal; Notable for the following:    Iron 17 (*)    Saturation Ratios 5 (*)    All other components within normal limits  FERRITIN - Abnormal; Notable for the following:    Ferritin 10 (*)    All other components within normal limits  RETICULOCYTES - Abnormal; Notable for the following:    Retic Ct Pct 4.3 (*)    RBC. 2.92 (*)    All other components within normal limits  VITAMIN B12  FOLATE  POC OCCULT BLOOD, ED    EKG  EKG Interpretation None       Radiology No results found.  Procedures Procedures (including critical care time)  Medications Ordered in ED Medications - No data to display   Initial Impression / Assessment and Plan / ED Course  I have reviewed the triage vital signs and the nursing notes.  Pertinent labs & imaging results that were available during my care of the patient were reviewed by me and considered in my medical decision making (see chart for details).     Patient here for evaluation of anemia, referred from [place]. He reports being asymptomatic in the emergency department. CBC today demonstrates hemoglobin of 8, 7.5 on lab review from yesterday. He has no evidence of active bleeding on examination and appears to be asymptomatic at this time. He has been referred for outpatient  workup of renal masses concerning for renal cell carcinoma. Discussed with patient findings of anemia, appears to be iron deficient based on lab panel. Discussed initiating iron supplementation as well as close outpatient follow-up and return precautions.  Final Clinical Impressions(s) / ED Diagnoses   Final diagnoses:  Iron deficiency anemia, unspecified iron deficiency anemia type    New Prescriptions Discharge Medication List as of 08/19/2016 12:54 PM       Tilden Fossaees, Nahiem Dredge, MD 08/19/16 1756

## 2016-08-19 NOTE — ED Triage Notes (Signed)
Pt states he was here last night and I had my blood tests done, I left before I saw a doctor. Pt back to see what his tests results were. Pt states he was here last night to get his blood checked.

## 2016-08-19 NOTE — ED Notes (Signed)
Patient ambulated to the restroom

## 2016-08-19 NOTE — ED Notes (Signed)
Patient refused discharge vitals.

## 2016-08-19 NOTE — ED Notes (Signed)
MD at bedside. 

## 2016-08-19 NOTE — Discharge Instructions (Signed)
Your hemoglobin was low today at 8.0. Please follow-up with your family doctor for further treatment and testing. It appears that your iron levels are low in your blood.  Start taking an iron supplement, available over-the-counter.

## 2016-08-19 NOTE — ED Provider Notes (Signed)
I did not see or evaluate this patient as they left the emergency department before being seen by medical provider.    Azalia Bilisampos, Aimar Borghi, MD 08/19/16 [phone]

## 2018-04-18 ENCOUNTER — Other Ambulatory Visit: Payer: Self-pay

## 2018-04-18 ENCOUNTER — Ambulatory Visit (HOSPITAL_COMMUNITY)
Admission: EM | Admit: 2018-04-18 | Discharge: 2018-04-18 | Disposition: A | Discharge status: Home / Self Care | Payer: No Typology Code available for payment source | Attending: Family Medicine | Admitting: Family Medicine

## 2018-04-18 ENCOUNTER — Encounter (HOSPITAL_COMMUNITY): Payer: Self-pay | Admitting: Family Medicine

## 2018-04-18 DIAGNOSIS — K047 Periapical abscess without sinus: Secondary | ICD-10-CM | POA: Diagnosis not present

## 2018-04-18 MED ORDER — KETOROLAC TROMETHAMINE 60 MG/2ML IM SOLN
INTRAMUSCULAR | Status: AC
  Filled 2018-04-18: qty 2

## 2018-04-18 MED ORDER — PENICILLIN V POTASSIUM 500 MG PO TABS: 500.0000 mg | ORAL_TABLET | Freq: Three times a day (TID) | ORAL | 0 refills | Status: AC

## 2018-04-18 MED ORDER — KETOROLAC TROMETHAMINE 60 MG/2ML IM SOLN: 60.0000 mg | Freq: Once | INTRAMUSCULAR | Status: DC

## 2018-04-18 MED ORDER — TRAMADOL HCL 50 MG PO TABS: 50.0000 mg | ORAL_TABLET | Freq: Four times a day (QID) | ORAL | 0 refills | Status: AC | PRN

## 2018-04-18 MED FILL — traMADol HCL 50 MG TABS: 50 | 3 days supply | Qty: 15 | Fill #0

## 2018-04-18 MED FILL — PENICILLIN VK 500 MG TABLET: 500 | 10 days supply | Qty: 30 | Fill #0

## 2018-04-18 NOTE — ED Triage Notes (Signed)
Pt presents with oral swelling and dental pain on the right side X 2 days.

## 2018-04-18 NOTE — ED Notes (Signed)
Pt refused Toradol injection.  Pt will take his Tramadol when he gets home.  Provider aware.

## 2018-04-18 NOTE — Discharge Instructions (Signed)
[facility]

## 2018-04-18 NOTE — ED Provider Notes (Signed)
[facility]    CSN: [id number] Arrival date & time: 04/18/18  1443     History   Chief Complaint Chief Complaint  Patient presents with  . Dental Pain  . Oral Swelling    HPI Eric Mckee is a 63 y.o. male.   Pt presents with oral swelling and dental pain on the right side X 2 days.  He does not have a dentist and does not have healthcare through [facility].  He works at [employer].  He gets most of his health care through the [place].  Patient states he did not take his blood pressure medicine today.  I encouraged him to get back on his medication.     Past Medical History:  Diagnosis Date  . Hypertension     There are no active problems to display for this patient.   Past Surgical History:  Procedure Laterality Date  . ABDOMINAL SURGERY         Home Medications    Prior to Admission medications   Medication Sig Start Date End Date Taking? Authorizing Provider  amLODipine (NORVASC) 5 MG tablet Take 2 tablets (10 mg total) by mouth daily. Patient not taking: Reported on 08/07/2016 03/23/15   Charlestine Night, PA-C  Aspirin-Salicylamide-Caffeine (BC HEADACHE POWDER PO) Take 1 packet by mouth 2 (two) times daily as needed (headache/pain).     [provider]  benazepril (LOTENSIN) 10 MG tablet Take 1 tablet (10 mg total) by mouth daily. Patient not taking: Reported on 08/07/2016 03/23/15   Charlestine Night, PA-C  lisinopril (PRINIVIL,ZESTRIL) 5 MG tablet Take 5 mg by mouth daily.    [provider]  ondansetron (ZOFRAN ODT) 4 MG disintegrating tablet Take 1 tablet (4 mg total) by mouth every 8 (eight) hours as needed for nausea or vomiting. 08/07/16   Street, [place], PA-C  penicillin v potassium (VEETID) 500 MG tablet Take 1 tablet (500 mg total) by mouth 3 (three) times daily. 04/18/18   Elvina Sidle, MD  ranitidine (ZANTAC) 150 MG tablet Take 1 tablet (150 mg total) by mouth 2 (two) times daily. 08/07/16   Street, [place], PA-C   traMADol (ULTRAM) 50 MG tablet Take 1 tablet (50 mg total) by mouth every 6 (six) hours as needed. 04/18/18   Elvina Sidle, MD    Family History History reviewed. No pertinent family history.  Social History Social History   Tobacco Use  . Smoking status: Former Games developer  . Smokeless tobacco: Never Used  Substance Use Topics  . Alcohol use: Yes    Comment: "EVERY NOW AND THEN"  . Drug use: Yes    Types: Marijuana     Allergies   Patient has no known allergies.   Review of Systems Review of Systems   Physical Exam Triage Vital Signs ED Triage Vitals [04/18/18 1521]  Enc Vitals Group     BP (!) 183/130     Pulse Rate 85     Resp 20     Temp 98.4 F (36.9 C)     Temp Source Oral     SpO2 100 %     Weight      Height      Head Circumference      Peak Flow      Pain Score 10     Pain Loc      Pain Edu?      Excl. in GC?    No data found.  Updated Vital Signs BP (!) 183/130 (BP Location:  Right Arm)   Pulse 85   Temp 98.4 F (36.9 C) (Oral)   Resp 20   SpO2 100%    Physical Exam Vitals signs and nursing note reviewed.  Constitutional:      Appearance: He is normal weight.  HENT:     Head: Normocephalic.     Mouth/Throat:     Comments: Multiple broken teeth and caries, swollen gums, swollen right cheek Neck:     Musculoskeletal: Normal range of motion and neck supple.  Pulmonary:     Effort: Pulmonary effort is normal.  Musculoskeletal: Normal range of motion.  Lymphadenopathy:     Cervical: Cervical adenopathy present.  Skin:    General: Skin is warm.  Neurological:     General: No focal deficit present.     Mental Status: He is alert.      UC Treatments / Results  Labs (all labs ordered are listed, but only abnormal results are displayed) Labs Reviewed - No data to display  EKG None  Radiology No results found.  Procedures Procedures (including critical care time)  Medications Ordered in UC Medications  ketorolac (TORADOL)  injection 60 mg (has no administration in time range)    Initial Impression / Assessment and Plan / UC Course  I have reviewed the triage vital signs and the nursing notes.  Pertinent labs & imaging results that were available during my care of the patient were reviewed by me and considered in my medical decision making (see chart for details).    Final Clinical Impressions(s) / UC Diagnoses   Final diagnoses:  Dental abscess     Discharge Instructions     [facility]    ED Prescriptions    Medication Sig Dispense Auth. Provider   penicillin v potassium (VEETID) 500 MG tablet Take 1 tablet (500 mg total) by mouth 3 (three) times daily. 30 tablet Elvina Sidle, MD   traMADol (ULTRAM) 50 MG tablet Take 1 tablet (50 mg total) by mouth every 6 (six) hours as needed. 15 tablet Elvina Sidle, MD     Controlled Substance Prescriptions [place] Controlled Substance Registry consulted? Not Applicable   Elvina Sidle, MD 04/18/18 1534

## 2018-07-31 IMAGING — CT CT ABD-PELV W/ CM
2 of 5 series · 15 of 46 positions shown, 17 images · IV contrast (Omni 300)
Comparison: None.

CLINICAL DATA: Vomiting for 3 days. Decreased appetite. Epigastric
pain. Constipation. Evaluate for obstruction or perforation.

EXAM:
CT ABDOMEN AND PELVIS WITH CONTRAST
TECHNIQUE: Multidetector CT imaging of the abdomen and pelvis was performed
using the standard protocol following bolus administration of
intravenous contrast.
CONTRAST:  100mL [id number] IOPAMIDOL ([id number]) INJECTION 61%

[Series 3: a/p w/ 5mm · axial · 0.71mm/px · z∈[+671,+1081]mm · 12 of 92 slices shown, 14 images]
[im 5/92  soft-tissue]
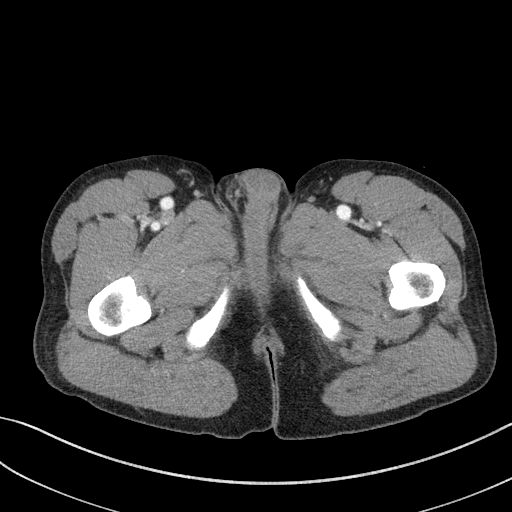
[im 5/92  bone]
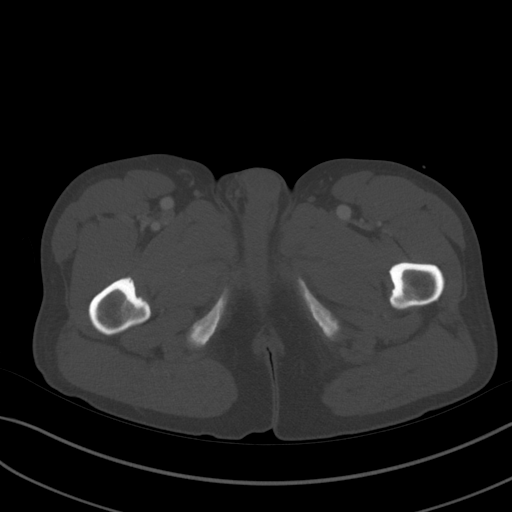
[im 15/92  soft-tissue]
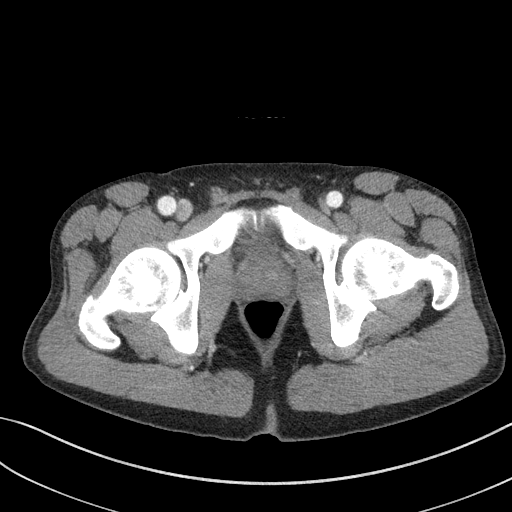
[im 20/92  soft-tissue]
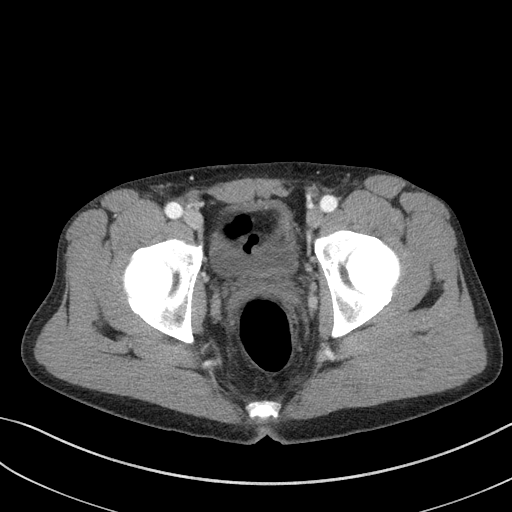
[im 29/92  soft-tissue]
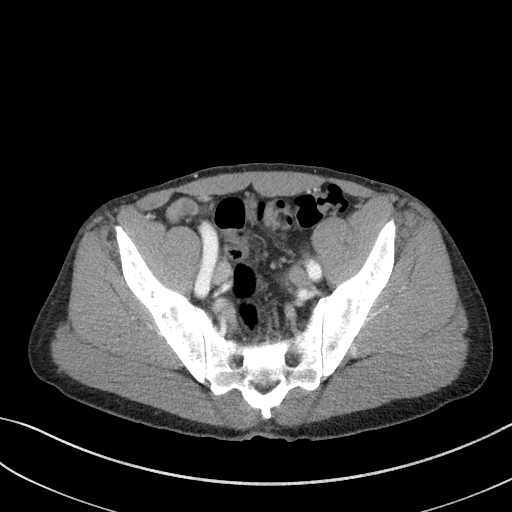
[im 34/92  soft-tissue]
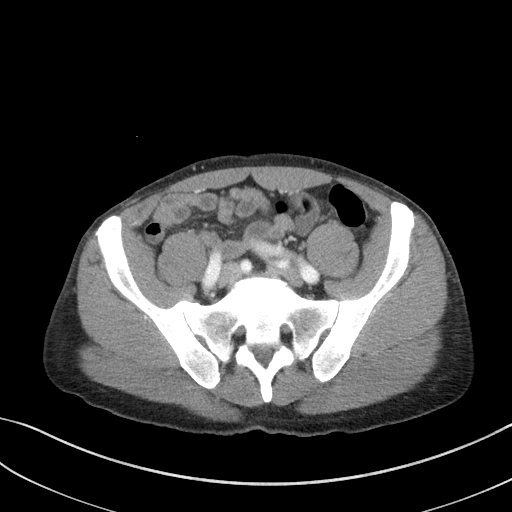
[im 44/92  soft-tissue]
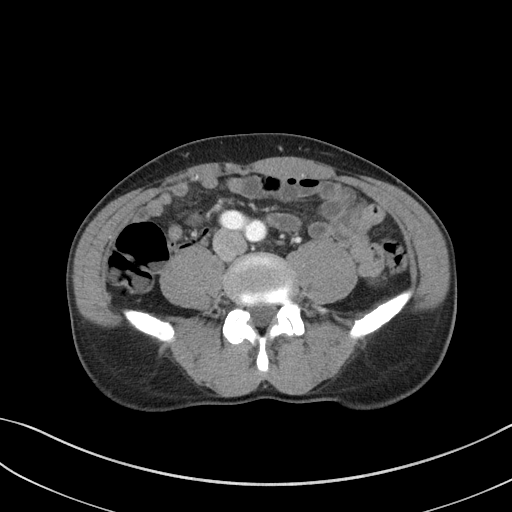
[im 48/92  soft-tissue]
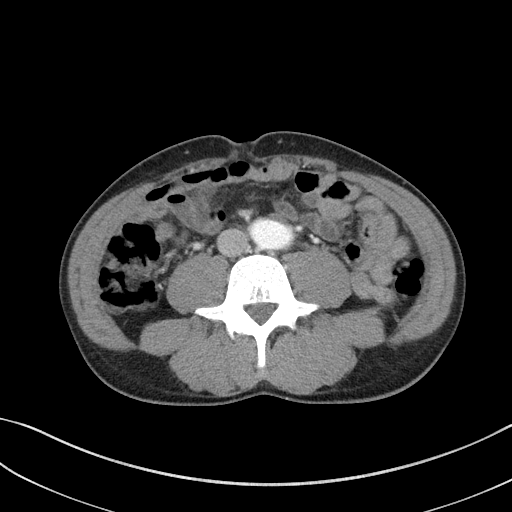
[im 58/92  soft-tissue]
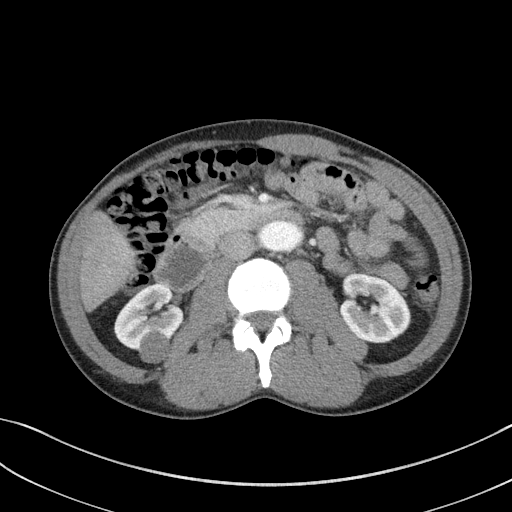
[im 63/92  soft-tissue]
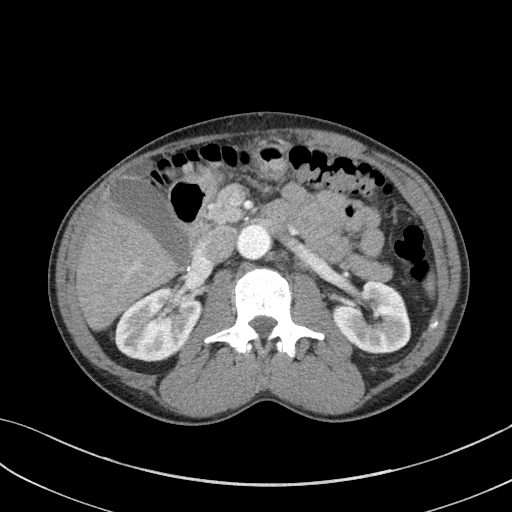
[im 63/92  bone]
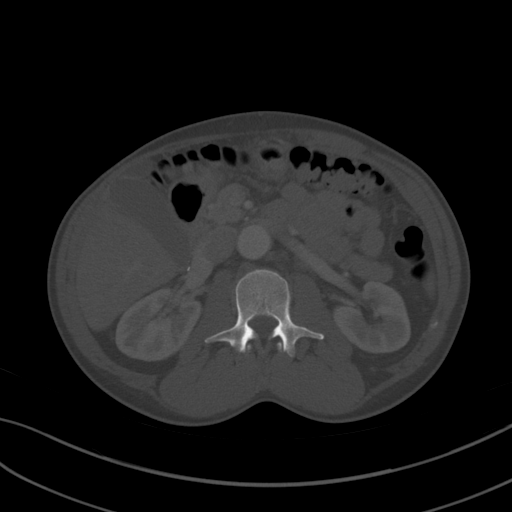
[im 72/92  soft-tissue]
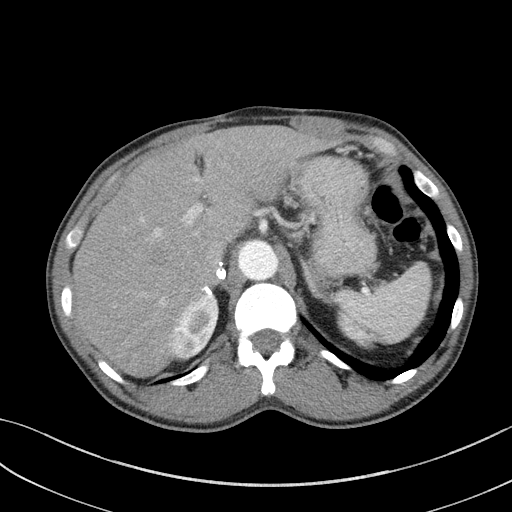
[im 77/92  soft-tissue]
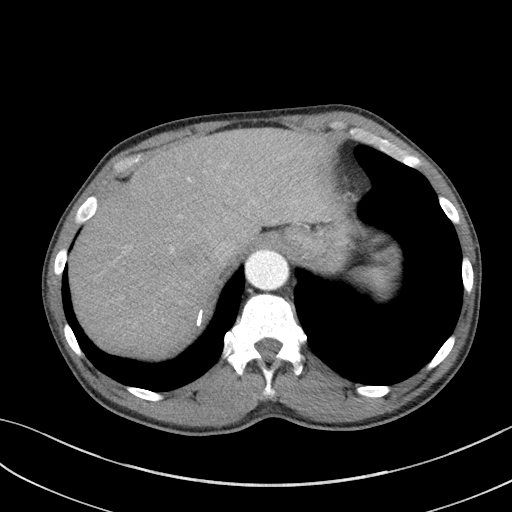
[im 87/92  soft-tissue]
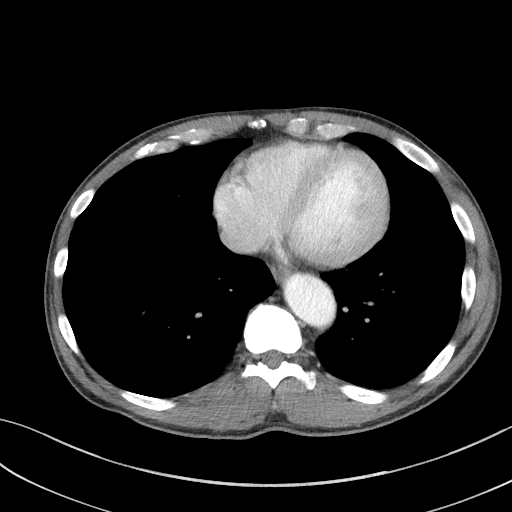

[Series 6: a/p w/ cor · coronal · 0.68mm/px · 3 of 119 slices shown]
[im 40/119  soft-tissue]
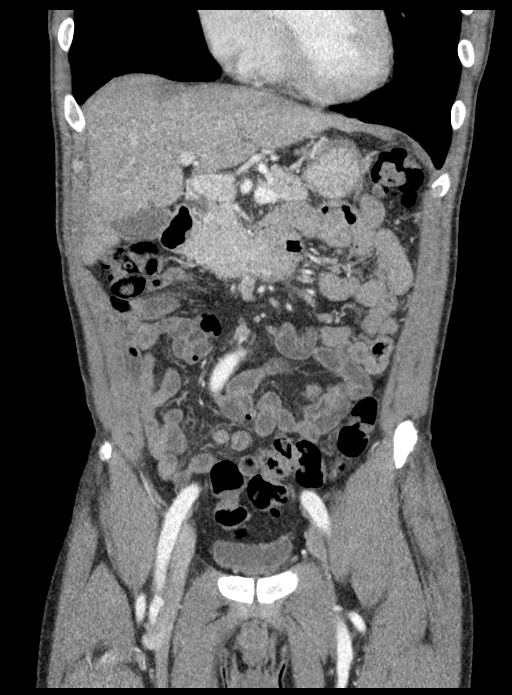
[im 53/119  soft-tissue]
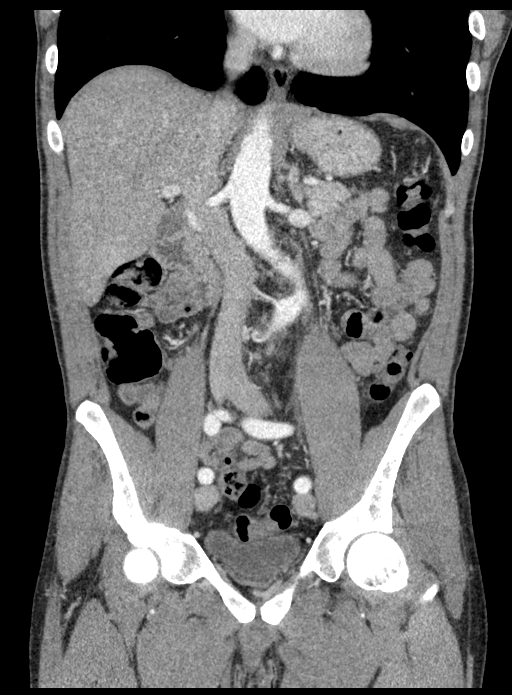
[im 66/119  soft-tissue]
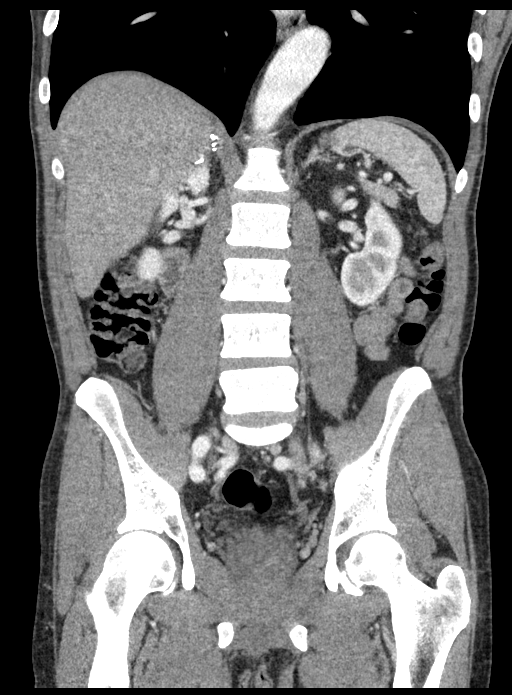

[15 of 46 positions shown; findings below may reference images not displayed]

FINDINGS: Lower chest: Clear lung bases. Normal heart size without pericardial
or pleural effusion.

Hepatobiliary: Surgical changes about the posterior right lobe of
the liver. No focal liver lesion. Normal gallbladder, without
biliary ductal dilatation.

Pancreas: Normal, without mass or ductal dilatation.

Spleen: Normal in size, without focal abnormality.

Adrenals/Urinary Tract: Normal left adrenal gland. Right renal too
small to characterize lesion. An lower pole right renal lesion
measures 2.2 cm and greater than fluid density, including on image
34/series 3.

Heterogeneous upper pole left renal lesion measures 2.4 cm on image
8/series a 8. This is likely enhancing, as evidenced by increased
density on nephrographic phase images relative to kidney delayed
images. No hydronephrosis. Normal urinary bladder.

Stomach/Bowel: Proximal gastric underdistention. Apparent gastric
wall thickening is likely secondary. Example image 22/series 3.
Normal colon, appendix, and terminal ileum. Normal small bowel.

Vascular/Lymphatic: Aortic and branch vessel atherosclerosis.
Ectasia of bilateral common iliac arteries, 1.5 cm on the right and
1.4 cm on the left. No abdominopelvic adenopathy.

Reproductive: Normal prostate.

Other: No significant free fluid. No free intraperitoneal air. Mild
ventral periumbilical wall laxity containing fat.

Musculoskeletal: Degenerate changes of both sacroiliac joints.
IMPRESSION: 1.  No acute process in the abdomen or pelvis.
2. **An incidental finding of potential clinical significance has
been found. Bilateral renal lesions. A left-sided upper pole lesion
is highly suspicious for renal cell carcinoma. An inter/lower pole
right-sided lesion is indeterminate and could represent a complex
cyst or solid neoplasm. Recommend nonemergent outpatient follow-up
with pre and post contrast abdominal MRI.**
3.  Aortic Atherosclerosis ([id number]).
4. Gastric underdistention. Apparent wall thickening could be
secondary. Cannot exclude gastritis or other gastric pathology.
These results were called by telephone at the time of interpretation
on 08/07/2016 at [DATE] to ATIBA, OWUDA., who verbally
acknowledged these results.
